# Patient Record
Sex: Female | Born: 1995 | Race: Black or African American | Hispanic: No | Marital: Single
Health system: Southern US, Community
[De-identification: ages and names within clinical notes are randomized; demographics above are authoritative.]

---

## 2017-09-13 ENCOUNTER — Inpatient Hospital Stay (HOSPITAL_COMMUNITY)
Admission: EM | Admit: 2017-09-13 | Discharge: 2017-09-21 | DRG: 080 | Disposition: E | Payer: Managed Care, Other (non HMO) | Attending: General Surgery | Admitting: General Surgery

## 2017-09-13 ENCOUNTER — Emergency Department (HOSPITAL_COMMUNITY): Payer: Managed Care, Other (non HMO)

## 2017-09-13 ENCOUNTER — Encounter (HOSPITAL_COMMUNITY): Payer: Self-pay | Admitting: Emergency Medicine

## 2017-09-13 ENCOUNTER — Inpatient Hospital Stay (HOSPITAL_COMMUNITY): Payer: Managed Care, Other (non HMO)

## 2017-09-13 ENCOUNTER — Other Ambulatory Visit: Payer: Self-pay

## 2017-09-13 DIAGNOSIS — G9382 Brain death: Secondary | ICD-10-CM | POA: Diagnosis present

## 2017-09-13 DIAGNOSIS — Y92410 Unspecified street and highway as the place of occurrence of the external cause: Secondary | ICD-10-CM | POA: Diagnosis not present

## 2017-09-13 DIAGNOSIS — I468 Cardiac arrest due to other underlying condition: Secondary | ICD-10-CM

## 2017-09-13 DIAGNOSIS — E873 Alkalosis: Secondary | ICD-10-CM | POA: Diagnosis not present

## 2017-09-13 DIAGNOSIS — R402312 Coma scale, best motor response, none, at arrival to emergency department: Secondary | ICD-10-CM | POA: Diagnosis present

## 2017-09-13 DIAGNOSIS — E875 Hyperkalemia: Secondary | ICD-10-CM | POA: Diagnosis present

## 2017-09-13 DIAGNOSIS — S0101XA Laceration without foreign body of scalp, initial encounter: Secondary | ICD-10-CM | POA: Diagnosis present

## 2017-09-13 DIAGNOSIS — E876 Hypokalemia: Secondary | ICD-10-CM | POA: Diagnosis not present

## 2017-09-13 DIAGNOSIS — G935 Compression of brain: Secondary | ICD-10-CM | POA: Diagnosis present

## 2017-09-13 DIAGNOSIS — J9811 Atelectasis: Secondary | ICD-10-CM

## 2017-09-13 DIAGNOSIS — I469 Cardiac arrest, cause unspecified: Secondary | ICD-10-CM | POA: Diagnosis present

## 2017-09-13 DIAGNOSIS — J9690 Respiratory failure, unspecified, unspecified whether with hypoxia or hypercapnia: Secondary | ICD-10-CM

## 2017-09-13 DIAGNOSIS — J9601 Acute respiratory failure with hypoxia: Secondary | ICD-10-CM | POA: Diagnosis present

## 2017-09-13 DIAGNOSIS — S27329A Contusion of lung, unspecified, initial encounter: Secondary | ICD-10-CM

## 2017-09-13 DIAGNOSIS — Z005 Encounter for examination of potential donor of organ and tissue: Secondary | ICD-10-CM | POA: Diagnosis not present

## 2017-09-13 DIAGNOSIS — E872 Acidosis: Secondary | ICD-10-CM | POA: Diagnosis not present

## 2017-09-13 DIAGNOSIS — Z529 Donor of unspecified organ or tissue: Secondary | ICD-10-CM

## 2017-09-13 DIAGNOSIS — R402212 Coma scale, best verbal response, none, at arrival to emergency department: Secondary | ICD-10-CM | POA: Diagnosis present

## 2017-09-13 DIAGNOSIS — G931 Anoxic brain damage, not elsewhere classified: Secondary | ICD-10-CM | POA: Diagnosis present

## 2017-09-13 DIAGNOSIS — G936 Cerebral edema: Principal | ICD-10-CM

## 2017-09-13 DIAGNOSIS — R4189 Other symptoms and signs involving cognitive functions and awareness: Secondary | ICD-10-CM

## 2017-09-13 DIAGNOSIS — R402112 Coma scale, eyes open, never, at arrival to emergency department: Secondary | ICD-10-CM | POA: Diagnosis present

## 2017-09-13 DIAGNOSIS — I959 Hypotension, unspecified: Secondary | ICD-10-CM | POA: Diagnosis present

## 2017-09-13 LAB — I-STAT ARTERIAL BLOOD GAS, ED
Acid-base deficit: 14 mmol/L — ABNORMAL HIGH (ref 0.0–2.0)
Acid-base deficit: 12 mmol/L — ABNORMAL HIGH (ref 0.0–2.0)
BICARBONATE: 14.9 mmol/L — AB (ref 20.0–28.0)
Bicarbonate: 16.4 mmol/L — ABNORMAL LOW (ref 20.0–28.0)
O2 Saturation: 100 %
O2 Saturation: 97 %
PH ART: 7.08 — AB (ref 7.350–7.450)
PO2 ART: 108 mmHg (ref 83.0–108.0)
Patient temperature: 98
TCO2: 18 mmol/L — AB (ref 22–32)
TCO2: 16 mmol/L — ABNORMAL LOW (ref 22–32)
pCO2 arterial: 55 mmHg — ABNORMAL HIGH (ref 32.0–48.0)
pCO2 arterial: 37 mmHg (ref 32.0–48.0)
pH, Arterial: 7.212 — ABNORMAL LOW (ref 7.350–7.450)
pO2, Arterial: 395 mmHg — ABNORMAL HIGH (ref 83.0–108.0)

## 2017-09-13 LAB — RAPID URINE DRUG SCREEN, HOSP PERFORMED
AMPHETAMINES: NOT DETECTED
Barbiturates: NOT DETECTED
Benzodiazepines: NOT DETECTED
Cocaine: NOT DETECTED
Opiates: NOT DETECTED
TETRAHYDROCANNABINOL: POSITIVE — AB

## 2017-09-13 LAB — CBC WITH DIFFERENTIAL/PLATELET
BASOS ABS: 0 10*3/uL (ref 0.0–0.1)
BASOS PCT: 0 %
EOS ABS: 0 10*3/uL (ref 0.0–0.7)
Eosinophils Relative: 0 %
HEMATOCRIT: 39.1 % (ref 36.0–46.0)
HEMOGLOBIN: 12.9 g/dL (ref 12.0–15.0)
Lymphocytes Relative: 6 %
Lymphs Abs: 0.8 10*3/uL (ref 0.7–4.0)
MCH: 27.6 pg (ref 26.0–34.0)
MCHC: 33 g/dL (ref 30.0–36.0)
MCV: 83.7 fL (ref 78.0–100.0)
Monocytes Absolute: 0.9 10*3/uL (ref 0.1–1.0)
Monocytes Relative: 6 %
NEUTROS ABS: 13.2 10*3/uL — AB (ref 1.7–7.7)
NEUTROS PCT: 88 %
Platelets: 255 10*3/uL (ref 150–400)
RBC: 4.67 MIL/uL (ref 3.87–5.11)
RDW: 14.1 % (ref 11.5–15.5)
WBC: 15 10*3/uL — AB (ref 4.0–10.5)

## 2017-09-13 LAB — BASIC METABOLIC PANEL
ANION GAP: 7 (ref 5–15)
BUN: 15 mg/dL (ref 6–20)
BUN: 13 mg/dL (ref 6–20)
CHLORIDE: 118 mmol/L — AB (ref 101–111)
CO2: 16 mmol/L — AB (ref 22–32)
CO2: 16 mmol/L — ABNORMAL LOW (ref 22–32)
Calcium: 7.1 mg/dL — ABNORMAL LOW (ref 8.9–10.3)
Calcium: 8.5 mg/dL — ABNORMAL LOW (ref 8.9–10.3)
Creatinine, Ser: 0.82 mg/dL (ref 0.44–1.00)
Creatinine, Ser: 0.84 mg/dL (ref 0.44–1.00)
GFR calc non Af Amer: >60 mL/min (ref >60)
GFR calc non Af Amer: >60 mL/min (ref >60)
Glucose, Bld: 119 mg/dL — ABNORMAL HIGH (ref 65–99)
Glucose, Bld: 176 mg/dL — ABNORMAL HIGH (ref 65–99)
POTASSIUM: 3.8 mmol/L (ref 3.5–5.1)
Potassium: 5.6 mmol/L — ABNORMAL HIGH (ref 3.5–5.1)
SODIUM: 156 mmol/L — AB (ref 135–145)
Sodium: 141 mmol/L (ref 135–145)

## 2017-09-13 LAB — COMPREHENSIVE METABOLIC PANEL
ALBUMIN: 3 g/dL — AB (ref 3.5–5.0)
ALK PHOS: 102 U/L (ref 38–126)
ALT: 159 U/L — ABNORMAL HIGH (ref 14–54)
ANION GAP: 16 — AB (ref 5–15)
AST: 211 U/L — ABNORMAL HIGH (ref 15–41)
BUN: 19 mg/dL (ref 6–20)
CALCIUM: 7.8 mg/dL — AB (ref 8.9–10.3)
CHLORIDE: 110 mmol/L (ref 101–111)
CO2: 13 mmol/L — AB (ref 22–32)
Creatinine, Ser: 1.26 mg/dL — ABNORMAL HIGH (ref 0.44–1.00)
GFR calc Af Amer: >60 mL/min (ref >60)
GFR calc non Af Amer: >60 mL/min (ref >60)
GLUCOSE: 281 mg/dL — AB (ref 65–99)
POTASSIUM: 3.6 mmol/L (ref 3.5–5.1)
SODIUM: 139 mmol/L (ref 135–145)
Total Bilirubin: 0.5 mg/dL (ref 0.3–1.2)
Total Protein: 5.6 g/dL — ABNORMAL LOW (ref 6.5–8.1)

## 2017-09-13 LAB — CBC
HEMATOCRIT: 35.2 % — AB (ref 36.0–46.0)
HEMOGLOBIN: 11.4 g/dL — AB (ref 12.0–15.0)
MCH: 27.3 pg (ref 26.0–34.0)
MCHC: 32.4 g/dL (ref 30.0–36.0)
MCV: 84.2 fL (ref 78.0–100.0)
Platelets: 299 10*3/uL (ref 150–400)
RBC: 4.18 MIL/uL (ref 3.87–5.11)
RDW: 13.7 % (ref 11.5–15.5)
WBC: 14 10*3/uL — ABNORMAL HIGH (ref 4.0–10.5)

## 2017-09-13 LAB — BPAM FFP
BLOOD PRODUCT EXPIRATION DATE: 201901252359
Blood Product Expiration Date: 201901252359
ISSUE DATE / TIME: 201901240118
ISSUE DATE / TIME: 201901240118
Unit Type and Rh: 6200
Unit Type and Rh: 6200

## 2017-09-13 LAB — I-STAT CHEM 8, ED
BUN: 17 mg/dL (ref 6–20)
CALCIUM ION: 1.05 mmol/L — AB (ref 1.15–1.40)
CHLORIDE: 109 mmol/L (ref 101–111)
Creatinine, Ser: 1 mg/dL (ref 0.44–1.00)
GLUCOSE: 283 mg/dL — AB (ref 65–99)
HCT: 36 % (ref 36.0–46.0)
HEMOGLOBIN: 12.2 g/dL (ref 12.0–15.0)
Potassium: 3.6 mmol/L (ref 3.5–5.1)
SODIUM: 140 mmol/L (ref 135–145)
TCO2: 17 mmol/L — AB (ref 22–32)

## 2017-09-13 LAB — URINALYSIS, ROUTINE W REFLEX MICROSCOPIC
Bilirubin Urine: NEGATIVE
GLUCOSE, UA: 150 mg/dL — AB
KETONES UR: NEGATIVE mg/dL
LEUKOCYTES UA: NEGATIVE
Nitrite: NEGATIVE
PROTEIN: 30 mg/dL — AB
SQUAMOUS EPITHELIAL / LPF: NONE SEEN
Specific Gravity, Urine: 1.014 (ref 1.005–1.030)
pH: 6 (ref 5.0–8.0)

## 2017-09-13 LAB — GLUCOSE, CAPILLARY
GLUCOSE-CAPILLARY: 115 mg/dL — AB (ref 65–99)
GLUCOSE-CAPILLARY: 169 mg/dL — AB (ref 65–99)
Glucose-Capillary: 104 mg/dL — ABNORMAL HIGH (ref 65–99)
Glucose-Capillary: 105 mg/dL — ABNORMAL HIGH (ref 65–99)

## 2017-09-13 LAB — I-STAT BETA HCG BLOOD, ED (MC, WL, AP ONLY): I-stat hCG, quantitative: <5 m[IU]/mL (ref <5)

## 2017-09-13 LAB — I-STAT CG4 LACTIC ACID, ED: LACTIC ACID, VENOUS: 7.16 mmol/L — AB (ref 0.5–1.9)

## 2017-09-13 LAB — HIV ANTIBODY (ROUTINE TESTING W REFLEX): HIV SCREEN 4TH GENERATION: NONREACTIVE

## 2017-09-13 LAB — PREPARE FRESH FROZEN PLASMA
UNIT DIVISION: 0
Unit division: 0

## 2017-09-13 LAB — LACTIC ACID, PLASMA: LACTIC ACID, VENOUS: 2.1 mmol/L — AB (ref 0.5–1.9)

## 2017-09-13 LAB — APTT: aPTT: 32 seconds (ref 24–36)

## 2017-09-13 LAB — MRSA PCR SCREENING: MRSA BY PCR: NEGATIVE

## 2017-09-13 LAB — BLOOD PRODUCT ORDER (VERBAL) VERIFICATION

## 2017-09-13 LAB — TROPONIN I: Troponin I: <0.03 ng/mL (ref <0.03)

## 2017-09-13 LAB — PROTIME-INR
INR: 1.15
PROTHROMBIN TIME: 14.7 s (ref 11.4–15.2)

## 2017-09-13 LAB — CDS SEROLOGY

## 2017-09-13 LAB — ETHANOL

## 2017-09-13 LAB — POC URINE PREG, ED: Preg Test, Ur: NEGATIVE

## 2017-09-13 LAB — ABO/RH: ABO/RH(D): O POS

## 2017-09-13 MED ORDER — ALBUMIN HUMAN 5 % IV SOLN
12.5000 g | Freq: Once | INTRAVENOUS | Status: AC
  Administered 2017-09-13: 12.5 g via INTRAVENOUS
  Filled 2017-09-13: qty 250

## 2017-09-13 MED ORDER — ENOXAPARIN SODIUM 40 MG/0.4ML ~~LOC~~ SOLN
40.0000 mg | SUBCUTANEOUS | Status: DC
  Administered 2017-09-13 – 2017-09-14 (×2): 40 mg via SUBCUTANEOUS
  Filled 2017-09-13 (×3): qty 0.4

## 2017-09-13 MED ORDER — SODIUM POLYSTYRENE SULFONATE PO POWD
45.0000 g | Freq: Once | ORAL | Status: AC
  Administered 2017-09-13: 45 g via ORAL
  Filled 2017-09-13: qty 45

## 2017-09-13 MED ORDER — SODIUM CHLORIDE 0.9 % IV SOLN
20.0000 ug | Freq: Once | INTRAVENOUS | Status: AC
  Administered 2017-09-13: 20 ug via INTRAVENOUS
  Filled 2017-09-13: qty 5

## 2017-09-13 MED ORDER — SODIUM CHLORIDE 0.9 % IV SOLN
0.0000 ug/min | INTRAVENOUS | Status: DC
  Administered 2017-09-13: 60 ug/min via INTRAVENOUS
  Filled 2017-09-13: qty 1
  Filled 2017-09-13: qty 10

## 2017-09-13 MED ORDER — PANTOPRAZOLE SODIUM 40 MG PO TBEC: 40.0000 mg | DELAYED_RELEASE_TABLET | Freq: Every day | ORAL | Status: DC

## 2017-09-13 MED ORDER — DEXTROSE 5 % IV SOLN
0.0000 ug/min | INTRAVENOUS | Status: DC
  Administered 2017-09-13: 40 ug/min via INTRAVENOUS
  Administered 2017-09-13: 10 ug/min via INTRAVENOUS
  Administered 2017-09-13: 9 ug/min via INTRAVENOUS
  Administered 2017-09-14: 15 ug/min via INTRAVENOUS
  Filled 2017-09-13 (×4): qty 4

## 2017-09-13 MED ORDER — ALBUMIN HUMAN 25 % IV SOLN
12.5000 g | Freq: Once | INTRAVENOUS | Status: AC
  Administered 2017-09-13: 12.5 g via INTRAVENOUS
  Filled 2017-09-13: qty 50

## 2017-09-13 MED ORDER — ORAL CARE MOUTH RINSE
15.0000 mL | OROMUCOSAL | Status: DC
  Administered 2017-09-13 – 2017-09-14 (×12): 15 mL via OROMUCOSAL

## 2017-09-13 MED ORDER — SODIUM CHLORIDE 0.45 % IV SOLN
INTRAVENOUS | Status: DC
  Administered 2017-09-13 – 2017-09-14 (×4): via INTRAVENOUS

## 2017-09-13 MED ORDER — SODIUM CHLORIDE 0.9 % IV SOLN
INTRAVENOUS | Status: AC | PRN
  Administered 2017-09-13: 2000 mL via INTRAVENOUS

## 2017-09-13 MED ORDER — PANTOPRAZOLE SODIUM 40 MG IV SOLR
40.0000 mg | Freq: Every day | INTRAVENOUS | Status: DC
  Administered 2017-09-13 – 2017-09-14 (×2): 40 mg via INTRAVENOUS
  Filled 2017-09-13 (×2): qty 40

## 2017-09-13 MED ORDER — SODIUM CHLORIDE 0.9 % IV BOLUS (SEPSIS)
2000.0000 mL | Freq: Once | INTRAVENOUS | Status: AC
Start: 2017-09-13 — End: 2017-09-13
  Administered 2017-09-13: 2000 mL via INTRAVENOUS

## 2017-09-13 MED ORDER — SODIUM CHLORIDE 0.9 % IV SOLN
500.0000 mL | Freq: Once | INTRAVENOUS | Status: AC
  Administered 2017-09-13: 500 mL via INTRAVENOUS

## 2017-09-13 MED ORDER — SODIUM CHLORIDE 0.9 % IV SOLN
1.0000 g | Freq: Once | INTRAVENOUS | Status: AC
  Administered 2017-09-13: 1 g via INTRAVENOUS
  Filled 2017-09-13 (×2): qty 10

## 2017-09-13 MED ORDER — SODIUM POLYSTYRENE SULFONATE 15 GM/60ML PO SUSP
45.0000 g | Freq: Once | ORAL | Status: DC
  Filled 2017-09-13: qty 180

## 2017-09-13 MED ORDER — CHLORHEXIDINE GLUCONATE 0.12% ORAL RINSE (MEDLINE KIT)
15.0000 mL | Freq: Two times a day (BID) | OROMUCOSAL | Status: DC
  Administered 2017-09-13 – 2017-09-14 (×3): 15 mL via OROMUCOSAL

## 2017-09-13 MED ORDER — IPRATROPIUM-ALBUTEROL 0.5-2.5 (3) MG/3ML IN SOLN
3.0000 mL | Freq: Four times a day (QID) | RESPIRATORY_TRACT | Status: DC
  Administered 2017-09-13 – 2017-09-14 (×5): 3 mL via RESPIRATORY_TRACT
  Filled 2017-09-13 (×5): qty 3

## 2017-09-13 MED ORDER — IOPAMIDOL (ISOVUE-300) INJECTION 61%
INTRAVENOUS | Status: AC
  Filled 2017-09-13: qty 100

## 2017-09-13 MED ORDER — SODIUM CHLORIDE 0.9 % IV SOLN
INTRAVENOUS | Status: DC
  Administered 2017-09-13: 1000 mL via INTRAVENOUS

## 2017-09-13 NOTE — ED Notes (Signed)
EMS c-collar changed for [employer] Collar

## 2017-09-13 NOTE — Progress Notes (Signed)
Vent changes made after ABG per DR. Wyatt VT 500 an Fi02 50

## 2017-09-13 NOTE — Progress Notes (Signed)
CRITICAL VALUE ALERT  Critical Value:  Cl > 130  Date & Time Notied:  09/07/2017  Provider Notified: Dr Janee Mornhompson  Orders Received/Actions taken: Switch fluids from 0.9% NS to .45% NS
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
## 2017-09-13 NOTE — ED Notes (Signed)
First unit completed 
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
## 2017-09-13 NOTE — Procedures (Signed)
Arterial Catheter Insertion Procedure Note Cindy Dougherty [id number] 01/16/1996  Procedure: Insertion of Arterial Catheter  Indications: Blood pressure monitoring  Procedure Details Consent: Unable to obtain consent because of altered level of consciousness. Time Out: Verified patient identification, verified procedure, site/side was marked, verified correct patient position, special equipment/implants available, medications/allergies/relevent history reviewed, required imaging and test results available.  Performed  Maximum sterile technique was used including antiseptics, cap, gloves, gown, hand hygiene, mask and sheet. Skin prep: Chlorhexidine; local anesthetic administered 20 gauge catheter was inserted into right radial artery using the Seldinger technique.  Evaluation Blood flow good; BP tracing good. Complications: No apparent complications.  Aline placed per MD order. No complications. Initial BP of 113/69.    Cindy Dougherty, Cindy Dougherty Cindy Dougherty 09/04/2017

## 2017-09-13 NOTE — Progress Notes (Signed)
E.T tube pulled back 1cm after chest x-ray per Dr.Wyatt.

## 2017-09-13 NOTE — ED Notes (Signed)
Mother at bedside.

## 2017-09-13 NOTE — ED Notes (Signed)
Mother here Dr. Lindie SpruceWyatt spoke with mother and gave status update.
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
## 2017-09-13 NOTE — Progress Notes (Signed)
RT transported pt from ED Trauma B to 4N26 on ventilator. Pt stable throughout with no complications. VS within normal limits. Report given to receiving RT at bedside. RT will continue to monitor.

## 2017-09-13 NOTE — Progress Notes (Signed)
Patient ID: Cindy Dougherty, female   DOB: 08/14/1996, 22 y.o.   MRN: [id number] Now requiring neo and levo for pressure support. Albumin bolus going. I told her mother about the MRI report. Appreciate neurology eval and they plan to F/U this afternoon.  Cindy GelinasBurke [name], MD, MPH, FACS Trauma: [phone] General Surgery: [phone]

## 2017-09-13 NOTE — Plan of Care (Signed)
Patient with satisfactory oxygen saturations through mechanical ventilation.

## 2017-09-13 NOTE — ED Notes (Signed)
Pt transported to CT now

## 2017-09-13 NOTE — ED Notes (Signed)
First unit PRBC started Num: [id number].
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
## 2017-09-14 ENCOUNTER — Inpatient Hospital Stay (HOSPITAL_COMMUNITY): Payer: Managed Care, Other (non HMO)

## 2017-09-14 DIAGNOSIS — Z005 Encounter for examination of potential donor of organ and tissue: Secondary | ICD-10-CM

## 2017-09-14 LAB — COMPREHENSIVE METABOLIC PANEL
ALK PHOS: 62 U/L (ref 38–126)
ALT: 77 U/L — ABNORMAL HIGH (ref 14–54)
ANION GAP: 11 (ref 5–15)
AST: 48 U/L — ABNORMAL HIGH (ref 15–41)
Albumin: 3.3 g/dL — ABNORMAL LOW (ref 3.5–5.0)
BUN: 6 mg/dL (ref 6–20)
CALCIUM: 8.4 mg/dL — AB (ref 8.9–10.3)
CO2: 19 mmol/L — AB (ref 22–32)
Chloride: 122 mmol/L — ABNORMAL HIGH (ref 101–111)
Creatinine, Ser: 0.95 mg/dL (ref 0.44–1.00)
GFR calc Af Amer: >60 mL/min (ref >60)
GFR calc non Af Amer: >60 mL/min (ref >60)
Glucose, Bld: 97 mg/dL (ref 65–99)
Potassium: 2.9 mmol/L — ABNORMAL LOW (ref 3.5–5.1)
SODIUM: 152 mmol/L — AB (ref 135–145)
Total Bilirubin: 1.3 mg/dL — ABNORMAL HIGH (ref 0.3–1.2)
Total Protein: 5.5 g/dL — ABNORMAL LOW (ref 6.5–8.1)

## 2017-09-14 LAB — BLOOD GAS, ARTERIAL
Acid-base deficit: 4.1 mmol/L — ABNORMAL HIGH (ref 0.0–2.0)
Acid-base deficit: 3 mmol/L — ABNORMAL HIGH (ref 0.0–2.0)
Acid-base deficit: 3.4 mmol/L — ABNORMAL HIGH (ref 0.0–2.0)
BICARBONATE: 19.6 mmol/L — AB (ref 20.0–28.0)
Bicarbonate: 20.2 mmol/L (ref 20.0–28.0)
Bicarbonate: 19.5 mmol/L — ABNORMAL LOW (ref 20.0–28.0)
DRAWN BY: 39898
DRAWN BY: 28340
Drawn by: 244801
FIO2: 40
FIO2: 40
FIO2: 40
LHR: 22 {breaths}/min
LHR: 22 {breaths}/min
MECHVT: 500 mL
O2 Saturation: 95.5 %
O2 Saturation: 99.2 %
O2 Saturation: 99 %
PATIENT TEMPERATURE: 98.6
PEEP/CPAP: 5 cmH2O
PEEP: 5 cmH2O
PEEP: 5 cmH2O
PH ART: 7.485 — AB (ref 7.350–7.450)
Patient temperature: 98.6
Patient temperature: 98.6
RATE: 22 resp/min
VT: 500 mL
VT: 500 mL
pCO2 arterial: 30.7 mmHg — ABNORMAL LOW (ref 32.0–48.0)
pCO2 arterial: 28.6 mmHg — ABNORMAL LOW (ref 32.0–48.0)
pCO2 arterial: 26.2 mmHg — ABNORMAL LOW (ref 32.0–48.0)
pH, Arterial: 7.42 (ref 7.350–7.450)
pH, Arterial: 7.462 — ABNORMAL HIGH (ref 7.350–7.450)
pO2, Arterial: 75.7 mmHg — ABNORMAL LOW (ref 83.0–108.0)
pO2, Arterial: 171 mmHg — ABNORMAL HIGH (ref 83.0–108.0)
pO2, Arterial: 137 mmHg — ABNORMAL HIGH (ref 83.0–108.0)

## 2017-09-14 LAB — HEMOGLOBIN A1C
HEMOGLOBIN A1C: 5.2 % (ref 4.8–5.6)
Mean Plasma Glucose: 102.54 mg/dL

## 2017-09-14 LAB — GLUCOSE, CAPILLARY
GLUCOSE-CAPILLARY: 151 mg/dL — AB (ref 65–99)
GLUCOSE-CAPILLARY: 96 mg/dL (ref 65–99)
GLUCOSE-CAPILLARY: 98 mg/dL (ref 65–99)
Glucose-Capillary: 101 mg/dL — ABNORMAL HIGH (ref 65–99)
Glucose-Capillary: 91 mg/dL (ref 65–99)
Glucose-Capillary: 87 mg/dL (ref 65–99)

## 2017-09-14 LAB — LIPASE, BLOOD: Lipase: 22 U/L (ref 11–51)

## 2017-09-14 LAB — ECHOCARDIOGRAM COMPLETE
HEIGHTINCHES: 64 in
WEIGHTICAEL: 2320 [oz_av]

## 2017-09-14 LAB — BASIC METABOLIC PANEL
Anion gap: 11 (ref 5–15)
BUN: 9 mg/dL (ref 6–20)
CHLORIDE: 119 mmol/L — AB (ref 101–111)
CO2: 17 mmol/L — AB (ref 22–32)
Calcium: 8 mg/dL — ABNORMAL LOW (ref 8.9–10.3)
Creatinine, Ser: 0.91 mg/dL (ref 0.44–1.00)
GFR calc non Af Amer: >60 mL/min (ref >60)
Glucose, Bld: 102 mg/dL — ABNORMAL HIGH (ref 65–99)
Potassium: 2.7 mmol/L — CL (ref 3.5–5.1)
Sodium: 147 mmol/L — ABNORMAL HIGH (ref 135–145)

## 2017-09-14 LAB — AMYLASE: Amylase: 281 U/L — ABNORMAL HIGH (ref 28–100)

## 2017-09-14 LAB — TYPE AND SCREEN
ABO/RH(D): O POS
Antibody Screen: NEGATIVE
UNIT DIVISION: 0
UNIT DIVISION: 0

## 2017-09-14 LAB — CBC
HEMATOCRIT: 35.1 % — AB (ref 36.0–46.0)
HEMOGLOBIN: 11.4 g/dL — AB (ref 12.0–15.0)
MCH: 26.5 pg (ref 26.0–34.0)
MCHC: 32.5 g/dL (ref 30.0–36.0)
MCV: 81.6 fL (ref 78.0–100.0)
Platelets: 214 10*3/uL (ref 150–400)
RBC: 4.3 MIL/uL (ref 3.87–5.11)
RDW: 14.2 % (ref 11.5–15.5)
WBC: 5.9 10*3/uL (ref 4.0–10.5)

## 2017-09-14 LAB — CBC WITH DIFFERENTIAL/PLATELET
BASOS ABS: 0 10*3/uL (ref 0.0–0.1)
Basophils Relative: 0 %
EOS ABS: 0.2 10*3/uL (ref 0.0–0.7)
EOS PCT: 3 %
HCT: 35.4 % — ABNORMAL LOW (ref 36.0–46.0)
Hemoglobin: 11.6 g/dL — ABNORMAL LOW (ref 12.0–15.0)
Lymphocytes Relative: 20 %
Lymphs Abs: 1.2 10*3/uL (ref 0.7–4.0)
MCH: 26.4 pg (ref 26.0–34.0)
MCHC: 32.8 g/dL (ref 30.0–36.0)
MCV: 80.6 fL (ref 78.0–100.0)
Monocytes Absolute: 0.4 10*3/uL (ref 0.1–1.0)
Monocytes Relative: 7 %
Neutro Abs: 4.2 10*3/uL (ref 1.7–7.7)
Neutrophils Relative %: 70 %
PLATELETS: 219 10*3/uL (ref 150–400)
RBC: 4.39 MIL/uL (ref 3.87–5.11)
RDW: 14.2 % (ref 11.5–15.5)
WBC: 6 10*3/uL (ref 4.0–10.5)

## 2017-09-14 LAB — BPAM RBC
Blood Product Expiration Date: 201901302359
Blood Product Expiration Date: 201902032359
ISSUE DATE / TIME: 201901240624
ISSUE DATE / TIME: 201901240117
Unit Type and Rh: 9500
Unit Type and Rh: 9500

## 2017-09-14 LAB — CK TOTAL AND CKMB (NOT AT ARMC)
CK TOTAL: 274 U/L — AB (ref 38–234)
CK, MB: 10.3 ng/mL — ABNORMAL HIGH (ref 0.5–5.0)
RELATIVE INDEX: 3.8 — AB (ref 0.0–2.5)

## 2017-09-14 LAB — PROTIME-INR
INR: 1.44
Prothrombin Time: 17.5 seconds — ABNORMAL HIGH (ref 11.4–15.2)

## 2017-09-14 LAB — APTT: aPTT: 43 seconds — ABNORMAL HIGH (ref 24–36)

## 2017-09-14 LAB — ABO/RH: ABO/RH(D): O POS

## 2017-09-14 LAB — MAGNESIUM: Magnesium: 1.3 mg/dL — ABNORMAL LOW (ref 1.7–2.4)

## 2017-09-14 LAB — BILIRUBIN, DIRECT: BILIRUBIN DIRECT: 0.3 mg/dL (ref 0.1–0.5)

## 2017-09-14 LAB — TROPONIN I: TROPONIN I: 0.33 ng/mL — AB (ref <0.03)

## 2017-09-14 LAB — PHOSPHORUS

## 2017-09-14 MED ORDER — TECHNETIUM TC 99M EXAMETAZIME IV KIT
21.8000 | PACK | Freq: Once | INTRAVENOUS | Status: AC | PRN
  Administered 2017-09-14: 21.8 via INTRAVENOUS

## 2017-09-14 MED ORDER — POTASSIUM PHOSPHATES 15 MMOLE/5ML IV SOLN
40.0000 meq | Freq: Once | INTRAVENOUS | Status: AC
  Administered 2017-09-14: 40 meq via INTRAVENOUS
  Filled 2017-09-14: qty 9.09

## 2017-09-14 MED ORDER — DEXTROSE 50 % IV SOLN
1.0000 | Freq: Once | INTRAVENOUS | Status: AC
  Administered 2017-09-14: 50 mL via INTRAVENOUS
  Filled 2017-09-14: qty 50

## 2017-09-14 MED ORDER — POTASSIUM CHLORIDE 10 MEQ/50ML IV SOLN
10.0000 meq | INTRAVENOUS | Status: DC
  Administered 2017-09-14 (×3): 10 meq via INTRAVENOUS
  Filled 2017-09-14 (×6): qty 50

## 2017-09-14 MED ORDER — SODIUM BICARBONATE 8.4 % IV SOLN
50.0000 meq | Freq: Once | INTRAVENOUS | Status: AC
  Administered 2017-09-14: 50 meq via INTRAVENOUS
  Filled 2017-09-14 (×2): qty 50

## 2017-09-14 MED ORDER — SODIUM BICARBONATE 8.4 % IV SOLN
50.0000 meq | Freq: Once | INTRAVENOUS | Status: DC
  Filled 2017-09-14: qty 50

## 2017-09-14 MED ORDER — STERILE WATER FOR INJECTION IV SOLN
INTRAVENOUS | Status: DC
  Administered 2017-09-14 – 2017-09-15 (×2): via INTRAVENOUS
  Filled 2017-09-14 (×5): qty 850

## 2017-09-14 MED ORDER — INSULIN ASPART 100 UNIT/ML ~~LOC~~ SOLN
20.0000 [IU] | Freq: Once | SUBCUTANEOUS | Status: AC
  Administered 2017-09-14: 20 [IU] via SUBCUTANEOUS

## 2017-09-14 MED ORDER — ALBUMIN HUMAN 25 % IV SOLN
12.5000 g | Freq: Once | INTRAVENOUS | Status: AC
  Administered 2017-09-14: 12.5 g via INTRAVENOUS
  Filled 2017-09-14: qty 100

## 2017-09-14 MED ORDER — MAGNESIUM SULFATE 2 GM/50ML IV SOLN
2.0000 g | Freq: Once | INTRAVENOUS | Status: AC
  Administered 2017-09-14: 2 g via INTRAVENOUS
  Filled 2017-09-14: qty 50

## 2017-09-14 MED ORDER — NOREPINEPHRINE BITARTRATE 1 MG/ML IV SOLN
0.0000 ug/min | INTRAVENOUS | Status: DC
  Administered 2017-09-14: 17 ug/min via INTRAVENOUS
  Administered 2017-09-14: 5 ug/min via INTRAVENOUS
  Filled 2017-09-14 (×2): qty 16

## 2017-09-14 MED ORDER — SODIUM CHLORIDE 0.9 % IV SOLN
2000.0000 mg | Freq: Once | INTRAVENOUS | Status: AC
  Administered 2017-09-14: 2000 mg via INTRAVENOUS
  Filled 2017-09-14: qty 16

## 2017-09-14 MED ORDER — VASOPRESSIN 20 UNIT/ML IV SOLN
0.4000 [IU]/h | INTRAVENOUS | Status: DC
  Administered 2017-09-14: 0.4 [IU]/h via INTRAVENOUS
  Filled 2017-09-14: qty 1

## 2017-09-14 MED ORDER — LEVOTHYROXINE SODIUM 100 MCG IV SOLR
20.0000 ug | Freq: Once | INTRAVENOUS | Status: AC
  Administered 2017-09-14: 20 ug via INTRAVENOUS
  Filled 2017-09-14: qty 5

## 2017-09-14 MED ORDER — SODIUM CHLORIDE 0.9 % IV SOLN
10.0000 ug/h | INTRAVENOUS | Status: DC
  Administered 2017-09-14: 10 ug/h via INTRAVENOUS
  Administered 2017-09-15 – 2017-09-16 (×3): 20 ug/h via INTRAVENOUS
  Filled 2017-09-14 (×4): qty 10
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
## 2017-09-14 NOTE — Progress Notes (Signed)
  Echocardiogram 2D Echocardiogram has been performed.  Cindy Dougherty, Cindy Dougherty 09/03/2017, 3:46 PM

## 2017-09-14 NOTE — Clinical Social Work Note (Signed)
Clinical Child psychotherapistocial Worker and RN completed ink hand prints and provided to patient mother.  RN had previously received permission from patient mother and CSW cleared with PA.  CSW remains available for support to patient family and staff as needed.  Macario GoldsJesse Brannon Levene, [place] [phone]

## 2017-09-14 NOTE — Progress Notes (Signed)
CRITICAL VALUE ALERT   Critical Value:  K+ 2.7  Date & Time Notied:  09/01/2017, 16100715  Provider Notified: Megan MansJ. Wyatt, MD  Orders Received/Actions taken: will order K+ supplement

## 2017-09-14 NOTE — Progress Notes (Signed)
Brain flow study performed and supports the clinical diagnosis of brain death with lack of cerebral blood flow.  She has no gag, cough, corneal reflexes, pupillary response to light, Doll's eyes, spontaneous movement, peripheral reflexes.  Time of death 11:55 AM.  Marta LamasJames O. Gae BonWyatt, III, MD, FACS [phone] Trauma Surgeon
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
## 2017-09-14 NOTE — Progress Notes (Signed)
Recruitment maneuver done per Donor protocol. ABG to follow.
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
## 2017-09-14 NOTE — Procedures (Signed)
Bronchoscopy Procedure Note Cindy Dougherty [id number] 03/04/1996  Procedure: Bronchoscopy Indications: evaluation for donation  Procedure Details Consent: per CDS Time Out: Verified patient identification, verified procedure, site/side was marked, verified correct patient position, special equipment/implants available, medications/allergies/relevent history reviewed, required imaging and test results available.  Performed  In preparation for procedure, patient was given 100% FiO2. Sedation: brain death  Airway entered and the following bronchi were examined: RUL, RML, RLL, LUL, LLL and Bronchi.  Small amount of tan secretions. Anatomy looked normal. Procedures performed: lavage Bronchoscope removed.    Evaluation Hemodynamic Status: BP stable throughout; O2 sats: stable throughout Patient's Current Condition: brain death Specimens:  Sent tan fluid Complications: none   Cindy MaladyBurke E Kalaysia Dougherty 08/23/2017

## 2017-09-14 NOTE — Procedures (Signed)
Under sterile conditions, her Cordis was prepped and draped. A triple lumen central line was placed via te Cordis without difficulty. Covered with a central line dressing. Good blood return.  Cindy GelinasBurke Bransyn Adami, MD, MPH, FACS Trauma: [phone] General Surgery: [phone]
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
## 2017-09-14 NOTE — Progress Notes (Signed)
Patient is completely unresponsive.  Still on a bit of Levophed, but making goo durine.  DI yesterday seems to have resolved with one dose..\  No gag, cough, corneal, pupils or Doll's eyes.  No spontaneous movement or movement to pain.  ABG shows respiratory alkalosis with added metabolic acidosis.  Hypokalemia to be treated with 6 runs of KCL.    Patient's mother and sister are at the patient's bedside and are completely understanding of the brain flow study to be done this morning.    I suspect clinically that she will have no flow.  Marta LamasJames O. Gae BonWyatt, III, MD, FACS [phone] Trauma Surgeon

## 2017-09-14 NOTE — Progress Notes (Signed)
Called lab to notify that patient needs blood cultures drawn. Was told the phlebotomist will be notified.

## 2017-09-15 ENCOUNTER — Inpatient Hospital Stay (HOSPITAL_COMMUNITY): Payer: Managed Care, Other (non HMO)

## 2017-09-15 DIAGNOSIS — Z005 Encounter for examination of potential donor of organ and tissue: Secondary | ICD-10-CM

## 2017-09-15 LAB — POCT I-STAT 3, ART BLOOD GAS (G3+)
ACID-BASE DEFICIT: 3 mmol/L — AB (ref 0.0–2.0)
Acid-base deficit: 3 mmol/L — ABNORMAL HIGH (ref 0.0–2.0)
Acid-base deficit: 5 mmol/L — ABNORMAL HIGH (ref 0.0–2.0)
Acid-base deficit: 1 mmol/L (ref 0.0–2.0)
Bicarbonate: 20.3 mmol/L (ref 20.0–28.0)
Bicarbonate: 20.2 mmol/L (ref 20.0–28.0)
Bicarbonate: 20.9 mmol/L (ref 20.0–28.0)
Bicarbonate: 24.5 mmol/L (ref 20.0–28.0)
O2 Saturation: 100 %
O2 Saturation: 100 %
O2 Saturation: 99 %
O2 Saturation: 100 %
PCO2 ART: 26.6 mmHg — AB (ref 32.0–48.0)
PCO2 ART: 28.8 mmHg — AB (ref 32.0–48.0)
PH ART: 7.488 — AB (ref 7.350–7.450)
PH ART: 7.45 (ref 7.350–7.450)
Patient temperature: 36.3
Patient temperature: 36.2
TCO2: 21 mmol/L — ABNORMAL LOW (ref 22–32)
TCO2: 21 mmol/L — ABNORMAL LOW (ref 22–32)
TCO2: 22 mmol/L (ref 22–32)
TCO2: 26 mmol/L (ref 22–32)
pCO2 arterial: 38.8 mmHg (ref 32.0–48.0)
pCO2 arterial: 41.5 mmHg (ref 32.0–48.0)
pH, Arterial: 7.335 — ABNORMAL LOW (ref 7.350–7.450)
pH, Arterial: 7.375 (ref 7.350–7.450)
pO2, Arterial: 409 mmHg — ABNORMAL HIGH (ref 83.0–108.0)
pO2, Arterial: 160 mmHg — ABNORMAL HIGH (ref 83.0–108.0)
pO2, Arterial: 163 mmHg — ABNORMAL HIGH (ref 83.0–108.0)
pO2, Arterial: 424 mmHg — ABNORMAL HIGH (ref 83.0–108.0)

## 2017-09-15 LAB — APTT
APTT: 33 s (ref 24–36)
aPTT: 55 seconds — ABNORMAL HIGH (ref 24–36)
aPTT: 160 seconds — ABNORMAL HIGH (ref 24–36)

## 2017-09-15 LAB — URINALYSIS, ROUTINE W REFLEX MICROSCOPIC
BILIRUBIN URINE: NEGATIVE
BILIRUBIN URINE: NEGATIVE
Bacteria, UA: NONE SEEN
GLUCOSE, UA: NEGATIVE mg/dL
Glucose, UA: NEGATIVE mg/dL
HGB URINE DIPSTICK: NEGATIVE
Ketones, ur: 20 mg/dL — AB
Ketones, ur: 5 mg/dL — AB
NITRITE: NEGATIVE
Nitrite: NEGATIVE
PH: 9 — AB (ref 5.0–8.0)
PH: 6 (ref 5.0–8.0)
Protein, ur: 30 mg/dL — AB
Protein, ur: 30 mg/dL — AB
SPECIFIC GRAVITY, URINE: 1.012 (ref 1.005–1.030)
SPECIFIC GRAVITY, URINE: 1.018 (ref 1.005–1.030)

## 2017-09-15 LAB — CBC
HCT: 24.9 % — ABNORMAL LOW (ref 36.0–46.0)
HEMATOCRIT: 29.2 % — AB (ref 36.0–46.0)
HEMOGLOBIN: 9.9 g/dL — AB (ref 12.0–15.0)
Hemoglobin: 8.3 g/dL — ABNORMAL LOW (ref 12.0–15.0)
MCH: 26.9 pg (ref 26.0–34.0)
MCH: 27 pg (ref 26.0–34.0)
MCHC: 33.3 g/dL (ref 30.0–36.0)
MCHC: 33.9 g/dL (ref 30.0–36.0)
MCV: 80.8 fL (ref 78.0–100.0)
MCV: 79.8 fL (ref 78.0–100.0)
PLATELETS: 142 10*3/uL — AB (ref 150–400)
Platelets: 162 10*3/uL (ref 150–400)
RBC: 3.08 MIL/uL — ABNORMAL LOW (ref 3.87–5.11)
RBC: 3.66 MIL/uL — AB (ref 3.87–5.11)
RDW: 14.3 % (ref 11.5–15.5)
RDW: 14.3 % (ref 11.5–15.5)
WBC: 6.9 10*3/uL (ref 4.0–10.5)
WBC: 11.5 10*3/uL — AB (ref 4.0–10.5)

## 2017-09-15 LAB — COMPREHENSIVE METABOLIC PANEL
ALBUMIN: 2.9 g/dL — AB (ref 3.5–5.0)
ALK PHOS: 60 U/L (ref 38–126)
ALT: 64 U/L — ABNORMAL HIGH (ref 14–54)
ALT: 57 U/L — ABNORMAL HIGH (ref 14–54)
ANION GAP: 12 (ref 5–15)
AST: 36 U/L (ref 15–41)
AST: 33 U/L (ref 15–41)
Albumin: 2.8 g/dL — ABNORMAL LOW (ref 3.5–5.0)
Alkaline Phosphatase: 70 U/L (ref 38–126)
Anion gap: 10 (ref 5–15)
BILIRUBIN TOTAL: 1.2 mg/dL (ref 0.3–1.2)
BUN: 5 mg/dL — ABNORMAL LOW (ref 6–20)
CALCIUM: 7.8 mg/dL — AB (ref 8.9–10.3)
CHLORIDE: 116 mmol/L — AB (ref 101–111)
CO2: 17 mmol/L — ABNORMAL LOW (ref 22–32)
CO2: 17 mmol/L — ABNORMAL LOW (ref 22–32)
Calcium: 8.5 mg/dL — ABNORMAL LOW (ref 8.9–10.3)
Chloride: 119 mmol/L — ABNORMAL HIGH (ref 101–111)
Creatinine, Ser: 0.85 mg/dL (ref 0.44–1.00)
Creatinine, Ser: 0.92 mg/dL (ref 0.44–1.00)
GFR calc Af Amer: >60 mL/min (ref >60)
GFR calc Af Amer: >60 mL/min (ref >60)
GFR calc non Af Amer: >60 mL/min (ref >60)
GLUCOSE: 164 mg/dL — AB (ref 65–99)
Glucose, Bld: 270 mg/dL — ABNORMAL HIGH (ref 65–99)
POTASSIUM: 3.4 mmol/L — AB (ref 3.5–5.1)
Potassium: 2.8 mmol/L — ABNORMAL LOW (ref 3.5–5.1)
Sodium: 148 mmol/L — ABNORMAL HIGH (ref 135–145)
Sodium: 143 mmol/L (ref 135–145)
TOTAL PROTEIN: 5.3 g/dL — AB (ref 6.5–8.1)
Total Bilirubin: 1.1 mg/dL (ref 0.3–1.2)
Total Protein: 5.2 g/dL — ABNORMAL LOW (ref 6.5–8.1)

## 2017-09-15 LAB — PROTIME-INR
INR: 3.05
INR: 1.61
INR: 1.08
Prothrombin Time: 31.3 seconds — ABNORMAL HIGH (ref 11.4–15.2)
Prothrombin Time: 19 seconds — ABNORMAL HIGH (ref 11.4–15.2)
Prothrombin Time: 13.9 seconds (ref 11.4–15.2)

## 2017-09-15 LAB — GLUCOSE, CAPILLARY
GLUCOSE-CAPILLARY: 142 mg/dL — AB (ref 65–99)
GLUCOSE-CAPILLARY: 143 mg/dL — AB (ref 65–99)
GLUCOSE-CAPILLARY: 152 mg/dL — AB (ref 65–99)
GLUCOSE-CAPILLARY: 154 mg/dL — AB (ref 65–99)
GLUCOSE-CAPILLARY: 167 mg/dL — AB (ref 65–99)
GLUCOSE-CAPILLARY: 200 mg/dL — AB (ref 65–99)
GLUCOSE-CAPILLARY: 219 mg/dL — AB (ref 65–99)
GLUCOSE-CAPILLARY: 225 mg/dL — AB (ref 65–99)
GLUCOSE-CAPILLARY: 233 mg/dL — AB (ref 65–99)
Glucose-Capillary: 176 mg/dL — ABNORMAL HIGH (ref 65–99)
Glucose-Capillary: 259 mg/dL — ABNORMAL HIGH (ref 65–99)
Glucose-Capillary: 206 mg/dL — ABNORMAL HIGH (ref 65–99)
Glucose-Capillary: 128 mg/dL — ABNORMAL HIGH (ref 65–99)
Glucose-Capillary: 131 mg/dL — ABNORMAL HIGH (ref 65–99)

## 2017-09-15 LAB — BASIC METABOLIC PANEL
ANION GAP: 10 (ref 5–15)
BUN: <5 mg/dL — ABNORMAL LOW (ref 6–20)
CO2: 21 mmol/L — ABNORMAL LOW (ref 22–32)
Calcium: 9.1 mg/dL (ref 8.9–10.3)
Chloride: 119 mmol/L — ABNORMAL HIGH (ref 101–111)
Creatinine, Ser: 0.85 mg/dL (ref 0.44–1.00)
Glucose, Bld: 197 mg/dL — ABNORMAL HIGH (ref 65–99)
Potassium: 3.7 mmol/L (ref 3.5–5.1)
SODIUM: 150 mmol/L — AB (ref 135–145)

## 2017-09-15 LAB — ECHOCARDIOGRAM COMPLETE
Height: 64 in
WEIGHTICAEL: 2320 [oz_av]

## 2017-09-15 LAB — MAGNESIUM: Magnesium: 1.7 mg/dL (ref 1.7–2.4)

## 2017-09-15 LAB — AMYLASE: AMYLASE: 102 U/L — AB (ref 28–100)

## 2017-09-15 LAB — PHOSPHORUS: Phosphorus: 1.7 mg/dL — ABNORMAL LOW (ref 2.5–4.6)

## 2017-09-15 LAB — LIPASE, BLOOD: LIPASE: 20 U/L (ref 11–51)

## 2017-09-15 MED ORDER — DOBUTAMINE IN D5W 4-5 MG/ML-% IV SOLN: 2.5000 ug/kg/min | INTRAVENOUS | Status: DC

## 2017-09-15 MED ORDER — SODIUM CHLORIDE 0.45 % IV SOLN
INTRAVENOUS | Status: DC
  Administered 2017-09-15 (×2): via INTRAVENOUS
  Filled 2017-09-15 (×5): qty 1000

## 2017-09-15 MED ORDER — CALCIUM GLUCONATE 10 % IV SOLN
2.0000 g | Freq: Once | INTRAVENOUS | Status: AC
  Administered 2017-09-15: 2 g via INTRAVENOUS
  Filled 2017-09-15: qty 20

## 2017-09-15 MED ORDER — INSULIN REGULAR BOLUS VIA INFUSION
0.0000 [IU] | Freq: Three times a day (TID) | INTRAVENOUS | Status: DC
  Filled 2017-09-15: qty 10

## 2017-09-15 MED ORDER — VANCOMYCIN HCL IN DEXTROSE 1-5 GM/200ML-% IV SOLN
1000.0000 mg | Freq: Two times a day (BID) | INTRAVENOUS | Status: DC
  Administered 2017-09-15 (×2): 1000 mg via INTRAVENOUS
  Filled 2017-09-15 (×3): qty 200

## 2017-09-15 MED ORDER — DEXTROSE 50 % IV SOLN: 25.0000 mL | INTRAVENOUS | Status: DC | PRN

## 2017-09-15 MED ORDER — INSULIN ASPART 100 UNIT/ML ~~LOC~~ SOLN
12.0000 [IU] | Freq: Once | SUBCUTANEOUS | Status: AC
  Administered 2017-09-15: 12 [IU] via SUBCUTANEOUS

## 2017-09-15 MED ORDER — SODIUM CHLORIDE 0.45 % IV SOLN
INTRAVENOUS | Status: DC
  Administered 2017-09-15: 13:00:00 via INTRAVENOUS

## 2017-09-15 MED ORDER — SODIUM CHLORIDE 0.9 % IV SOLN
Freq: Once | INTRAVENOUS | Status: AC
  Administered 2017-09-15: 04:00:00 via INTRAVENOUS

## 2017-09-15 MED ORDER — POTASSIUM CHLORIDE 20 MEQ/15ML (10%) PO SOLN
40.0000 meq | Freq: Two times a day (BID) | ORAL | Status: AC
  Administered 2017-09-15 (×2): 40 meq
  Filled 2017-09-15: qty 30

## 2017-09-15 MED ORDER — INSULIN ASPART 100 UNIT/ML ~~LOC~~ SOLN
10.0000 [IU] | Freq: Once | SUBCUTANEOUS | Status: AC
  Administered 2017-09-15: 10 [IU] via SUBCUTANEOUS

## 2017-09-15 MED ORDER — FUROSEMIDE 10 MG/ML IJ SOLN
20.0000 mg | Freq: Once | INTRAMUSCULAR | Status: AC
  Administered 2017-09-15: 20 mg via INTRAVENOUS
  Filled 2017-09-15: qty 2

## 2017-09-15 MED ORDER — POTASSIUM PHOSPHATES 15 MMOLE/5ML IV SOLN
40.0000 meq | Freq: Once | INTRAVENOUS | Status: AC
  Administered 2017-09-15: 40 meq via INTRAVENOUS
  Filled 2017-09-15: qty 9.09

## 2017-09-15 MED ORDER — MAGNESIUM SULFATE 2 GM/50ML IV SOLN
2.0000 g | Freq: Once | INTRAVENOUS | Status: AC
  Administered 2017-09-15: 2 g via INTRAVENOUS
  Filled 2017-09-15: qty 50

## 2017-09-15 MED ORDER — VITAMIN K1 10 MG/ML IJ SOLN
10.0000 mg | Freq: Once | INTRAVENOUS | Status: AC
  Administered 2017-09-15: 10 mg via INTRAVENOUS
  Filled 2017-09-15: qty 1

## 2017-09-15 MED ORDER — SODIUM BICARBONATE 8.4 % IV SOLN
50.0000 meq | Freq: Once | INTRAVENOUS | Status: AC
  Administered 2017-09-15: 50 meq via INTRAVENOUS
  Filled 2017-09-15 (×3): qty 50

## 2017-09-15 MED ORDER — SODIUM CHLORIDE 0.9 % IV SOLN
INTRAVENOUS | Status: DC
  Administered 2017-09-15: 1.7 [IU]/h via INTRAVENOUS
  Filled 2017-09-15: qty 1

## 2017-09-15 MED ORDER — PIPERACILLIN-TAZOBACTAM 3.375 G IVPB 30 MIN
3.3750 g | Freq: Three times a day (TID) | INTRAVENOUS | Status: DC
Start: 2017-09-15 — End: 2017-09-17
  Administered 2017-09-15 – 2017-09-16 (×3): 3.375 g via INTRAVENOUS
  Filled 2017-09-15 (×5): qty 50

## 2017-09-15 MED ORDER — POTASSIUM CHLORIDE 20 MEQ/15ML (10%) PO SOLN
ORAL | Status: AC
  Filled 2017-09-15: qty 30

## 2017-09-15 NOTE — Progress Notes (Addendum)
Per ABG RR was decreased from 18 to 14 for CO2 at 15:00. Recruitment maneuver done per Donor protocol at 16:30. ABG to follow in 30 minutes.

## 2017-09-15 NOTE — Progress Notes (Signed)
Recruitment maneuver done per Donor protocol. ABG to follow in 30 minutes.

## 2017-09-15 NOTE — Progress Notes (Signed)
Decreased RR to18 from 22 per Donor to get CO2 in 35-45 range. Will get another gas in about 2 hours.
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
## 2017-09-15 NOTE — Progress Notes (Signed)
  Echocardiogram 2D Echocardiogram has been performed.  Janalyn HarderWest, Kaheem Halleck R 09/15/2017, 3:54 PM

## 2017-09-16 ENCOUNTER — Encounter (HOSPITAL_COMMUNITY): Admission: EM | Disposition: E | Payer: Self-pay | Source: Home / Self Care

## 2017-09-16 ENCOUNTER — Inpatient Hospital Stay (HOSPITAL_COMMUNITY): Payer: Managed Care, Other (non HMO) | Admitting: Certified Registered"

## 2017-09-16 HISTORY — PX: ORGAN PROCUREMENT: SHX5270

## 2017-09-16 LAB — CBC
HCT: 28.8 % — ABNORMAL LOW (ref 36.0–46.0)
HEMOGLOBIN: 9.7 g/dL — AB (ref 12.0–15.0)
MCH: 27.2 pg (ref 26.0–34.0)
MCHC: 33.7 g/dL (ref 30.0–36.0)
MCV: 80.7 fL (ref 78.0–100.0)
Platelets: 191 10*3/uL (ref 150–400)
RBC: 3.57 MIL/uL — ABNORMAL LOW (ref 3.87–5.11)
RDW: 14.9 % (ref 11.5–15.5)
WBC: 15.1 10*3/uL — ABNORMAL HIGH (ref 4.0–10.5)

## 2017-09-16 LAB — CULTURE, RESPIRATORY W GRAM STAIN

## 2017-09-16 LAB — POCT I-STAT 7, (LYTES, BLD GAS, ICA,H+H)
Acid-Base Excess: 1 mmol/L (ref 0.0–2.0)
Bicarbonate: 26 mmol/L (ref 20.0–28.0)
CALCIUM ION: 1.25 mmol/L (ref 1.15–1.40)
HEMATOCRIT: 27 % — AB (ref 36.0–46.0)
Hemoglobin: 9.2 g/dL — ABNORMAL LOW (ref 12.0–15.0)
O2 SAT: 100 %
POTASSIUM: 3.7 mmol/L (ref 3.5–5.1)
Patient temperature: 34
SODIUM: 162 mmol/L — AB (ref 135–145)
TCO2: 27 mmol/L (ref 22–32)
pCO2 arterial: 36.7 mmHg (ref 32.0–48.0)
pH, Arterial: 7.445 (ref 7.350–7.450)
pO2, Arterial: 423 mmHg — ABNORMAL HIGH (ref 83.0–108.0)

## 2017-09-16 LAB — CULTURE, RESPIRATORY

## 2017-09-16 LAB — COMPREHENSIVE METABOLIC PANEL
ALT: 49 U/L (ref 14–54)
ANION GAP: 11 (ref 5–15)
AST: 19 U/L (ref 15–41)
Albumin: 2.7 g/dL — ABNORMAL LOW (ref 3.5–5.0)
Alkaline Phosphatase: 87 U/L (ref 38–126)
BUN: 5 mg/dL — ABNORMAL LOW (ref 6–20)
CALCIUM: 8.5 mg/dL — AB (ref 8.9–10.3)
CO2: 22 mmol/L (ref 22–32)
Chloride: 115 mmol/L — ABNORMAL HIGH (ref 101–111)
Creatinine, Ser: 0.93 mg/dL (ref 0.44–1.00)
GFR calc non Af Amer: >60 mL/min (ref >60)
Glucose, Bld: 222 mg/dL — ABNORMAL HIGH (ref 65–99)
Potassium: 3.4 mmol/L — ABNORMAL LOW (ref 3.5–5.1)
SODIUM: 148 mmol/L — AB (ref 135–145)
TOTAL PROTEIN: 5.5 g/dL — AB (ref 6.5–8.1)
Total Bilirubin: 1 mg/dL (ref 0.3–1.2)

## 2017-09-16 LAB — BPAM FFP
Blood Product Expiration Date: 201901302359
ISSUE DATE / TIME: 201901260323
UNIT TYPE AND RH: 6200

## 2017-09-16 LAB — PREPARE FRESH FROZEN PLASMA: Unit division: 0

## 2017-09-16 LAB — POCT I-STAT 3, ART BLOOD GAS (G3+)
BICARBONATE: 24.8 mmol/L (ref 20.0–28.0)
O2 SAT: 100 %
TCO2: 26 mmol/L (ref 22–32)
pCO2 arterial: 38.1 mmHg (ref 32.0–48.0)
pH, Arterial: 7.421 (ref 7.350–7.450)
pO2, Arterial: 472 mmHg — ABNORMAL HIGH (ref 83.0–108.0)

## 2017-09-16 SURGERY — SURGICAL PROCUREMENT, ORGAN
Anesthesia: General

## 2017-09-16 MED ORDER — PHENYLEPHRINE 40 MCG/ML (10ML) SYRINGE FOR IV PUSH (FOR BLOOD PRESSURE SUPPORT)
PREFILLED_SYRINGE | INTRAVENOUS | Status: DC | PRN
  Administered 2017-09-16: 80 ug via INTRAVENOUS
  Administered 2017-09-16: 120 ug via INTRAVENOUS
  Administered 2017-09-16 (×4): 80 ug via INTRAVENOUS

## 2017-09-16 MED ORDER — SODIUM CHLORIDE 0.9 % IV SOLN
1000.0000 mg | Freq: Once | INTRAVENOUS | Status: AC
  Administered 2017-09-16: 06:00:00 via INTRAVENOUS
  Administered 2017-09-16: 1000 mg via INTRAVENOUS
  Filled 2017-09-16: qty 8

## 2017-09-16 MED ORDER — 0.9 % SODIUM CHLORIDE (POUR BTL) OPTIME
TOPICAL | Status: DC | PRN
  Administered 2017-09-16 (×2): 3000 mL

## 2017-09-16 MED ORDER — ALBUMIN HUMAN 5 % IV SOLN
INTRAVENOUS | Status: DC | PRN
  Administered 2017-09-16: 07:00:00 via INTRAVENOUS

## 2017-09-16 MED ORDER — STERILE WATER FOR INJECTION IV SOLN
50.0000 mg | Freq: Once | INTRAVENOUS | Status: DC
  Filled 2017-09-16: qty 50

## 2017-09-16 MED ORDER — FUROSEMIDE 10 MG/ML IJ SOLN
INTRAMUSCULAR | Status: AC
  Filled 2017-09-16: qty 8

## 2017-09-16 MED ORDER — FENTANYL CITRATE (PF) 250 MCG/5ML IJ SOLN
INTRAMUSCULAR | Status: AC
Start: 2017-09-16 — End: 2017-09-16
  Filled 2017-09-16: qty 5

## 2017-09-16 MED ORDER — PHENYLEPHRINE HCL 10 MG/ML IJ SOLN
INTRAVENOUS | Status: DC | PRN
  Administered 2017-09-16: 20 ug/min via INTRAVENOUS

## 2017-09-16 MED ORDER — FENTANYL CITRATE (PF) 250 MCG/5ML IJ SOLN
INTRAMUSCULAR | Status: DC | PRN
  Administered 2017-09-16 (×6): 50 ug via INTRAVENOUS

## 2017-09-16 MED ORDER — ALBUMIN HUMAN 25 % IV SOLN
25.0000 g | Freq: Once | INTRAVENOUS | Status: AC
  Administered 2017-09-16: 25 g via INTRAVENOUS
  Filled 2017-09-16: qty 50

## 2017-09-16 MED ORDER — ESMOLOL HCL 100 MG/10ML IV SOLN
INTRAVENOUS | Status: DC | PRN
  Administered 2017-09-16: 10 mg via INTRAVENOUS

## 2017-09-16 MED ORDER — EPHEDRINE SULFATE-NACL 50-0.9 MG/10ML-% IV SOSY
PREFILLED_SYRINGE | INTRAVENOUS | Status: DC | PRN
  Administered 2017-09-16: 10 mg via INTRAVENOUS

## 2017-09-16 MED ORDER — HEPARIN SODIUM (PORCINE) 1000 UNIT/ML IJ SOLN
INTRAMUSCULAR | Status: DC | PRN
  Administered 2017-09-16: 20000 [IU] via INTRAVENOUS

## 2017-09-16 MED ORDER — FUROSEMIDE 10 MG/ML IJ SOLN
INTRAMUSCULAR | Status: DC | PRN
  Administered 2017-09-16: 60 mg via INTRAMUSCULAR

## 2017-09-16 MED ORDER — LACTATED RINGERS IV SOLN
INTRAVENOUS | Status: DC | PRN
  Administered 2017-09-16: 06:00:00 via INTRAVENOUS

## 2017-09-16 MED ORDER — ROCURONIUM BROMIDE 100 MG/10ML IV SOLN
INTRAVENOUS | Status: DC | PRN
  Administered 2017-09-16: 100 mg via INTRAVENOUS

## 2017-09-16 SURGICAL SUPPLY — 96 items
APPLIER CLIP 11 MED OPEN (CLIP) ×3
APPLIER CLIP 9.375 MED OPEN (MISCELLANEOUS) ×3
APPLIER CLIP 9.375 SM OPEN (CLIP) ×3
BLADE 10 SAFETY STRL DISP (BLADE) ×3 IMPLANT
BLADE CLIPPER SURG (BLADE) IMPLANT
BLADE STERNUM SYSTEM 6 (BLADE) ×3 IMPLANT
BLADE SURG 10 STRL SS (BLADE) ×6 IMPLANT
CANNULA VESSEL W/WING WO/VALVE (CANNULA) IMPLANT
CLIP APPLIE 11 MED OPEN (CLIP) ×1 IMPLANT
CLIP APPLIE 9.375 MED OPEN (MISCELLANEOUS) ×1 IMPLANT
CLIP APPLIE 9.375 SM OPEN (CLIP) ×1 IMPLANT
CLIP VESOCCLUDE MED 24/CT (CLIP) ×3 IMPLANT
CLIP VESOCCLUDE SM WIDE 24/CT (CLIP) ×3 IMPLANT
CONT SPEC 4OZ CLIKSEAL STRL BL (MISCELLANEOUS) ×6 IMPLANT
COVER BACK TABLE 60X90IN (DRAPES) ×6 IMPLANT
COVER MAYO STAND STRL (DRAPES) ×3 IMPLANT
COVER SURGICAL LIGHT HANDLE (MISCELLANEOUS) ×3 IMPLANT
DRAPE HALF SHEET 40X57 (DRAPES) ×6 IMPLANT
DRAPE SLUSH MACHINE 52X66 (DRAPES) ×3 IMPLANT
DRAPE SLUSH/WARMER DISC (DRAPES) ×6 IMPLANT
DRSG COVADERM 4X10 (GAUZE/BANDAGES/DRESSINGS) ×6 IMPLANT
DRSG TEGADERM 2-3/8X2-3/4 SM (GAUZE/BANDAGES/DRESSINGS) ×6 IMPLANT
DRSG TELFA 3X8 NADH (GAUZE/BANDAGES/DRESSINGS) ×3 IMPLANT
DURAPREP 26ML APPLICATOR (WOUND CARE) ×6 IMPLANT
ELECT BLADE 6.5 EXT (BLADE) ×3 IMPLANT
ELECT REM PT RETURN 9FT ADLT (ELECTROSURGICAL) ×6
ELECTRODE REM PT RTRN 9FT ADLT (ELECTROSURGICAL) ×2 IMPLANT
GAUZE SPONGE 4X4 16PLY XRAY LF (GAUZE/BANDAGES/DRESSINGS) IMPLANT
GLOVE BIO SURGEON STRL SZ 6 (GLOVE) ×6 IMPLANT
GLOVE BIO SURGEON STRL SZ 6.5 (GLOVE) ×2 IMPLANT
GLOVE BIO SURGEON STRL SZ7 (GLOVE) ×15 IMPLANT
GLOVE BIO SURGEONS STRL SZ 6.5 (GLOVE) ×1
GLOVE BIOGEL PI IND STRL 7.0 (GLOVE) ×5 IMPLANT
GLOVE BIOGEL PI INDICATOR 7.0 (GLOVE) ×10
GLOVE ECLIPSE 7.5 STRL STRAW (GLOVE) ×12 IMPLANT
GLOVE SURG SS PI 7.0 STRL IVOR (GLOVE) ×21 IMPLANT
GOWN STRL REUS W/ TWL LRG LVL3 (GOWN DISPOSABLE) ×8 IMPLANT
GOWN STRL REUS W/TWL LRG LVL3 (GOWN DISPOSABLE) ×16
HEMOSTAT SURGICEL 2X14 (HEMOSTASIS) ×3 IMPLANT
KIT POST MORTEM ADULT 36X90 (BAG) ×3 IMPLANT
KIT ROOM TURNOVER OR (KITS) ×3 IMPLANT
LIGASURE IMPACT 36 18CM CVD LR (INSTRUMENTS) ×3 IMPLANT
LOOP VESSEL MAXI BLUE (MISCELLANEOUS) ×3 IMPLANT
LOOP VESSEL MINI RED (MISCELLANEOUS) ×3 IMPLANT
MANIFOLD NEPTUNE II (INSTRUMENTS) IMPLANT
NEEDLE BIOPSY 14X6 SOFT TISS (NEEDLE) ×3 IMPLANT
NS IRRIG 1000ML POUR BTL (IV SOLUTION) IMPLANT
PACK AORTA (CUSTOM PROCEDURE TRAY) ×3 IMPLANT
PAD ARMBOARD 7.5X6 YLW CONV (MISCELLANEOUS) ×6 IMPLANT
PENCIL BUTTON HOLSTER BLD 10FT (ELECTRODE) ×3 IMPLANT
RELOAD PROXIMATE 75MM BLUE (ENDOMECHANICALS) ×9 IMPLANT
RELOAD STAPLER WHITE 60MM (STAPLE) ×3 IMPLANT
SLEEVE SURGEON STRL (DRAPES) ×3 IMPLANT
SOL PREP POV-IOD 4OZ 10% (MISCELLANEOUS) ×3 IMPLANT
SOLUTION BETADINE 4OZ (MISCELLANEOUS) ×3 IMPLANT
SPONGE INTESTINAL PEANUT (DISPOSABLE) ×6 IMPLANT
SPONGE LAP 18X18 X RAY DECT (DISPOSABLE) ×9 IMPLANT
STAPLE ECHEON FLEX 60 POW ENDO (STAPLE) ×3 IMPLANT
STAPLER RELOAD WHITE 60MM (STAPLE) ×9
STAPLER VISISTAT 35W (STAPLE) ×6 IMPLANT
SUCTION POOLE TIP (SUCTIONS) ×12 IMPLANT
SUT BONE WAX W31G (SUTURE) ×3 IMPLANT
SUT ETHIBOND 5 LR DA (SUTURE) ×21 IMPLANT
SUT ETHILON 1 LR 30 (SUTURE) ×9 IMPLANT
SUT ETHILON 2 LR (SUTURE) IMPLANT
SUT PROLENE 4 0 RB 1 (SUTURE) ×4
SUT PROLENE 4-0 RB1 18X2 ARM (SUTURE) ×2 IMPLANT
SUT PROLENE 6 0 BV (SUTURE) ×3 IMPLANT
SUT SILK  1 MH (SUTURE) ×12
SUT SILK 0 TIES 10X30 (SUTURE) ×3 IMPLANT
SUT SILK 1 MH (SUTURE) ×6 IMPLANT
SUT SILK 1 SH (SUTURE) ×12 IMPLANT
SUT SILK 1 TIES 10X30 (SUTURE) IMPLANT
SUT SILK 2 0 (SUTURE)
SUT SILK 2 0 SH (SUTURE) IMPLANT
SUT SILK 2 0 SH CR/8 (SUTURE) ×3 IMPLANT
SUT SILK 2 0 TIES 10X30 (SUTURE) ×3 IMPLANT
SUT SILK 2-0 18XBRD TIE 12 (SUTURE) IMPLANT
SUT SILK 3 0 TIES 10X30 (SUTURE) IMPLANT
SUT SILK 4 0 (SUTURE) ×2
SUT SILK 4 0 SH CR/8 (SUTURE) ×3 IMPLANT
SUT SILK 4-0 18XBRD TIE 12 (SUTURE) ×1 IMPLANT
SWAB COLLECTION DEVICE MRSA (MISCELLANEOUS) IMPLANT
SWAB CULTURE ESWAB REG 1ML (MISCELLANEOUS) IMPLANT
SYR BULB IRRIGATION 50ML (SYRINGE) ×3 IMPLANT
SYRINGE TOOMEY DISP (SYRINGE) ×3 IMPLANT
TAPE UMBILICAL 1/8 X36 TWILL (MISCELLANEOUS) ×6 IMPLANT
TAPE UMBILICAL COTTON 1/8X30 (MISCELLANEOUS) ×3 IMPLANT
TOWEL OR 17X24 6PK STRL BLUE (TOWEL DISPOSABLE) ×3 IMPLANT
TOWEL OR 17X26 10 PK STRL BLUE (TOWEL DISPOSABLE) ×6 IMPLANT
TUBE CONNECTING 12'X1/4 (SUCTIONS) ×3
TUBE CONNECTING 12X1/4 (SUCTIONS) ×6 IMPLANT
TUBE CONNECTING 20'X1/4 (TUBING) ×1
TUBE CONNECTING 20X1/4 (TUBING) ×2 IMPLANT
WATER STERILE IRR 1000ML POUR (IV SOLUTION) IMPLANT
YANKAUER SUCT BULB TIP NO VENT (SUCTIONS) ×3 IMPLANT

## 2017-09-17 ENCOUNTER — Encounter (HOSPITAL_COMMUNITY): Payer: Self-pay

## 2017-09-17 LAB — URINE CULTURE

## 2017-09-17 NOTE — Anesthesia Postprocedure Evaluation (Addendum)
Anesthesia Post Note  Patient: Cindy Dougherty  Procedure(s) Performed: ORGAN PROCUREMENT-HEART, LUNGS, LIVER, KIDNEYS (N/A )     Patient location during evaluation: Other Anesthesia Type: General Anesthetic complications: no Comments: Patient deceased prior to arrival to OR for organ donation.     Last Vitals:  Vitals:   09/15/2017 0400 08/27/2017 0500  BP: (!) 115/52 (!) 106/53  Pulse: (!) 102 99  Resp: 16 16  Temp: 36.7 C (!) 36.3 C  SpO2: 100% 100%    Last Pain:  Vitals:   09/15/17 0800  TempSrc: Other (Comment)  PainSc:                  Beryle Lathehomas E Santana Edell

## 2017-09-20 LAB — CULTURE, BLOOD (ROUTINE X 2)
CULTURE: NO GROWTH
Culture: NO GROWTH
Special Requests: ADEQUATE
Special Requests: ADEQUATE
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
## 2017-09-21 NOTE — Transfer of Care (Addendum)
Immediate Anesthesia Transfer of Care Note  Patient: Cindy Dougherty  Procedure(s) Performed: ORGAN PROCUREMENT-HEART, LUNGS, LIVER, KIDNEYS (N/A )  Patient Location: PACU and Patient left in care of [facility]   Anesthesia Type:General  Level of Consciousness: Patient remains intubated per anesthesia plan  Airway & Oxygen Therapy: patient remains in OR for kidney donation, ventilator and oxygen discontinued following lung donation  Post-op Assessment: Report given to RN  Post vital signs: Reviewed  Last Vitals:  Vitals:   02-24-18 0400 02-24-18 0500  BP: (!) 115/52 (!) 106/53  Pulse: (!) 102 99  Resp: 16 16  Temp: 36.7 C (!) 36.3 C  SpO2: 100% 100%    Last Pain:  Vitals:   09/15/17 0800  TempSrc: Other (Comment)  PainSc:          Complications: patient condition as expected following this procedure

## 2017-09-21 NOTE — OR Nursing (Signed)
Organ procurement Aorta Cross clamp 09:04 Heart 09:24  Node 09:30 Lungs 09:34 Pancreas 09:47 Liver 09:44 Left kidney 0951  Right Kidney [phone]   All specimens given to Travel MD from different hospital for transport

## 2017-09-21 DEATH — deceased
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
